# Patient Record
Sex: Female | Born: 2000 | Race: Black or African American | Hispanic: No | Marital: Single | Smoking: Never smoker
Health system: Southern US, Community
[De-identification: ages and names within clinical notes are randomized; demographics above are authoritative.]

---

## 2018-08-05 ENCOUNTER — Encounter (HOSPITAL_COMMUNITY): Payer: Self-pay | Admitting: Emergency Medicine

## 2018-08-05 ENCOUNTER — Emergency Department (HOSPITAL_COMMUNITY)
Admission: EM | Admit: 2018-08-05 | Discharge: 2018-08-06 | Disposition: A | Discharge status: Home / Self Care | Payer: Medicaid Other | Attending: Emergency Medicine | Admitting: Emergency Medicine

## 2018-08-05 ENCOUNTER — Other Ambulatory Visit: Payer: Self-pay

## 2018-08-05 DIAGNOSIS — R1084 Generalized abdominal pain: Secondary | ICD-10-CM

## 2018-08-05 DIAGNOSIS — N72 Inflammatory disease of cervix uteri: Secondary | ICD-10-CM | POA: Insufficient documentation

## 2018-08-05 DIAGNOSIS — N760 Acute vaginitis: Secondary | ICD-10-CM | POA: Insufficient documentation

## 2018-08-05 DIAGNOSIS — Z79899 Other long term (current) drug therapy: Secondary | ICD-10-CM | POA: Insufficient documentation

## 2018-08-05 DIAGNOSIS — B9689 Other specified bacterial agents as the cause of diseases classified elsewhere: Secondary | ICD-10-CM

## 2018-08-05 NOTE — ED Triage Notes (Signed)
C/o generalized abd pain x 3 days with nausea.  States she felt better yesterday and pain worse again today.  Also reports sore throat that started today.

## 2018-08-06 ENCOUNTER — Emergency Department (HOSPITAL_COMMUNITY): Payer: Medicaid Other

## 2018-08-06 LAB — GROUP A STREP BY PCR: GROUP A STREP BY PCR: NOT DETECTED

## 2018-08-06 LAB — WET PREP, GENITAL
Sperm: NONE SEEN
Trich, Wet Prep: NONE SEEN
Yeast Wet Prep HPF POC: NONE SEEN

## 2018-08-06 LAB — CBC
HEMATOCRIT: 43.4 % (ref 36.0–46.0)
HEMOGLOBIN: 14.6 g/dL (ref 12.0–15.0)
MCH: 28.5 pg (ref 26.0–34.0)
MCHC: 33.6 g/dL (ref 30.0–36.0)
MCV: 84.6 fL (ref 78.0–100.0)
Platelets: 293 10*3/uL (ref 150–400)
RBC: 5.13 MIL/uL — AB (ref 3.87–5.11)
RDW: 12.3 % (ref 11.5–15.5)
WBC: 9 10*3/uL (ref 4.0–10.5)

## 2018-08-06 LAB — URINALYSIS, ROUTINE W REFLEX MICROSCOPIC
Bilirubin Urine: NEGATIVE
Glucose, UA: NEGATIVE mg/dL
KETONES UR: 5 mg/dL — AB
Nitrite: NEGATIVE
PH: 8 (ref 5.0–8.0)
PROTEIN: 30 mg/dL — AB
Specific Gravity, Urine: 1.009 (ref 1.005–1.030)

## 2018-08-06 LAB — COMPREHENSIVE METABOLIC PANEL
ALT: 23 U/L (ref 0–44)
AST: 28 U/L (ref 15–41)
Albumin: 3.8 g/dL (ref 3.5–5.0)
Alkaline Phosphatase: 72 U/L (ref 38–126)
Anion gap: 11 (ref 5–15)
BUN: 7 mg/dL (ref 6–20)
CHLORIDE: 101 mmol/L (ref 98–111)
CO2: 22 mmol/L (ref 22–32)
CREATININE: 1.3 mg/dL — AB (ref 0.44–1.00)
Calcium: 9 mg/dL (ref 8.9–10.3)
GFR, EST NON AFRICAN AMERICAN: 59 mL/min — AB (ref >60)
Glucose, Bld: 113 mg/dL — ABNORMAL HIGH (ref 70–99)
POTASSIUM: 3.8 mmol/L (ref 3.5–5.1)
SODIUM: 134 mmol/L — AB (ref 135–145)
Total Bilirubin: 0.7 mg/dL (ref 0.3–1.2)
Total Protein: 7.3 g/dL (ref 6.5–8.1)

## 2018-08-06 LAB — I-STAT BETA HCG BLOOD, ED (MC, WL, AP ONLY)

## 2018-08-06 LAB — LIPASE, BLOOD: LIPASE: 35 U/L (ref 11–51)

## 2018-08-06 MED ORDER — DOXYCYCLINE HYCLATE 100 MG PO CAPS: 100.0000 mg | ORAL_CAPSULE | Freq: Two times a day (BID) | ORAL | 0 refills | Status: DC

## 2018-08-06 MED ORDER — IOPAMIDOL (ISOVUE-300) INJECTION 61%
100.0000 mL | Freq: Once | INTRAVENOUS | Status: AC | PRN
  Administered 2018-08-06: 100 mL via INTRAVENOUS

## 2018-08-06 MED ORDER — METRONIDAZOLE 500 MG PO TABS: 500.0000 mg | ORAL_TABLET | Freq: Two times a day (BID) | ORAL | 0 refills | Status: DC

## 2018-08-06 MED ORDER — SODIUM CHLORIDE 0.9 % IV SOLN
1.0000 g | Freq: Once | INTRAVENOUS | Status: AC
  Administered 2018-08-06: 1 g via INTRAVENOUS
  Filled 2018-08-06: qty 10

## 2018-08-06 MED ORDER — IBUPROFEN 800 MG PO TABS: 800.0000 mg | ORAL_TABLET | Freq: Four times a day (QID) | ORAL | 0 refills | Status: AC | PRN

## 2018-08-06 MED ORDER — SODIUM CHLORIDE 0.9 % IV BOLUS
1000.0000 mL | Freq: Once | INTRAVENOUS | Status: AC
  Administered 2018-08-06: 1000 mL via INTRAVENOUS

## 2018-08-06 MED ORDER — IOPAMIDOL (ISOVUE-300) INJECTION 61%
INTRAVENOUS | Status: AC
  Filled 2018-08-06: qty 100

## 2018-08-06 MED ORDER — ONDANSETRON HCL 4 MG PO TABS: 4.0000 mg | ORAL_TABLET | Freq: Four times a day (QID) | ORAL | 0 refills | Status: AC | PRN

## 2018-08-06 NOTE — ED Provider Notes (Signed)
[facility] EMERGENCY DEPARTMENT Provider Note   CSN: [id number] Arrival date & time: 08/05/18  2344     History   Chief Complaint Chief Complaint  Patient presents with  . Abdominal Pain  . Sore Throat    HPI Christy Adkins is a 17 y.o. female.  Patient presents to the emergency department for evaluation of abdominal pain.  She has been having diffuse abdominal pain with nausea but no vomiting for 3 days.  Patient has not had any diarrhea or constipation.  She denies urinary symptoms.  Pain is mostly in the central upper abdomen, but also in the lower abdomen.  She noticed a sore throat today.  No other cold symptoms.     History reviewed. No pertinent past medical history.  There are no active problems to display for this patient.   History reviewed. No pertinent surgical history.   OB History   None      Home Medications    Prior to Admission medications   Medication Sig Start Date End Date Taking? Authorizing Provider  norelgestromin-ethinyl estradiol (ORTHO EVRA) 150-35 MCG/24HR transdermal patch Place 1 patch onto the skin once a week.   Yes [provider]  doxycycline (VIBRAMYCIN) 100 MG capsule Take 1 capsule (100 mg total) by mouth 2 (two) times daily. 08/06/18   Gilda Crease, MD  ibuprofen (ADVIL,MOTRIN) 800 MG tablet Take 1 tablet (800 mg total) by mouth every 6 (six) hours as needed for moderate pain. 08/06/18   Gilda Crease, MD  metroNIDAZOLE (FLAGYL) 500 MG tablet Take 1 tablet (500 mg total) by mouth 2 (two) times daily. One po bid x 7 days 08/06/18   Gilda Crease, MD  ondansetron (ZOFRAN) 4 MG tablet Take 1 tablet (4 mg total) by mouth every 6 (six) hours as needed for nausea or vomiting. 08/06/18   Pollina, Canary Brim, MD    Family History No family history on file.  Social History Social History   Tobacco Use  . Smoking status: Never Smoker  . Smokeless tobacco: Never Used  Substance  Use Topics  . Alcohol use: Not Currently  . Drug use: Not Currently     Allergies   Patient has no known allergies.   Review of Systems Review of Systems  HENT: Positive for sore throat.   Gastrointestinal: Positive for abdominal pain and nausea.  All other systems reviewed and are negative.    Physical Exam Updated Vital Signs BP 118/74 (BP Location: Left Arm)   Pulse 75   Temp 99.2 F (37.3 C) (Oral)   Resp 17   LMP 07/29/2018   SpO2 100%   Physical Exam  Constitutional: She is oriented to person, place, and time. She appears well-developed and well-nourished. No distress.  HENT:  Head: Normocephalic and atraumatic.  Right Ear: Hearing normal.  Left Ear: Hearing normal.  Nose: Nose normal.  Mouth/Throat: Oropharynx is clear and moist and mucous membranes are normal.  Eyes: Pupils are equal, round, and reactive to light. Conjunctivae and EOM are normal.  Neck: Normal range of motion. Neck supple.  Cardiovascular: Regular rhythm, S1 normal and S2 normal. Exam reveals no gallop and no friction rub.  No murmur heard. Pulmonary/Chest: Effort normal and breath sounds normal. No respiratory distress. She exhibits no tenderness.  Abdominal: Soft. Normal appearance and bowel sounds are normal. There is no hepatosplenomegaly. There is tenderness in the right lower quadrant, epigastric area, suprapubic area and left lower quadrant. There is no rebound,  no guarding, no tenderness at McBurney's point and negative Murphy's sign. No hernia.  Genitourinary: Uterus normal. Cervix exhibits motion tenderness and friability. Right adnexum displays tenderness. Right adnexum displays no mass. Left adnexum displays tenderness. Left adnexum displays no mass. No signs of injury around the vagina. Vaginal discharge found.  Musculoskeletal: Normal range of motion.  Neurological: She is alert and oriented to person, place, and time. She has normal strength. No cranial nerve deficit or sensory  deficit. Coordination normal. GCS eye subscore is 4. GCS verbal subscore is 5. GCS motor subscore is 6.  Skin: Skin is warm, dry and intact. No rash noted. No cyanosis.  Psychiatric: She has a normal mood and affect. Her speech is normal and behavior is normal. Thought content normal.  Nursing note and vitals reviewed.    ED Treatments / Results  Labs (all labs ordered are listed, but only abnormal results are displayed) Labs Reviewed  WET PREP, GENITAL - Abnormal; Notable for the following components:      Result Value   Clue Cells Wet Prep HPF POC PRESENT (*)    WBC, Wet Prep HPF POC MANY (*)    All other components within normal limits  COMPREHENSIVE METABOLIC PANEL - Abnormal; Notable for the following components:   Sodium 134 (*)    Glucose, Bld 113 (*)    Creatinine, Ser 1.30 (*)    GFR calc non Af Amer 59 (*)    All other components within normal limits  CBC - Abnormal; Notable for the following components:   RBC 5.13 (*)    All other components within normal limits  URINALYSIS, ROUTINE W REFLEX MICROSCOPIC - Abnormal; Notable for the following components:   APPearance CLOUDY (*)    Hgb urine dipstick SMALL (*)    Ketones, ur 5 (*)    Protein, ur 30 (*)    Leukocytes, UA LARGE (*)    Bacteria, UA MANY (*)    All other components within normal limits  GROUP A STREP BY PCR  LIPASE, BLOOD  I-STAT BETA HCG BLOOD, ED ([facility], [facility], [facility] ONLY)  GC/CHLAMYDIA PROBE AMP ([place]) NOT AT [facility]    EKG None  Radiology Ct Abdomen Pelvis W Contrast  Result Date: 08/06/2018 CLINICAL DATA:  Waxing and waning abdominal pain with nausea. EXAM: CT ABDOMEN AND PELVIS WITH CONTRAST TECHNIQUE: Multidetector CT imaging of the abdomen and pelvis was performed using the standard protocol following bolus administration of intravenous contrast. CONTRAST:  100 mL ISOVUE-300 IOPAMIDOL (ISOVUE-300) INJECTION 61% COMPARISON:  None. FINDINGS: LOWER CHEST: Lung bases are clear. Included heart size is  normal. No pericardial effusion. HEPATOBILIARY: Liver and gallbladder are normal. PANCREAS: Normal. SPLEEN: Normal. ADRENALS/URINARY TRACT: Kidneys are orthotopic, demonstrating symmetric enhancement. No nephrolithiasis, hydronephrosis or solid renal masses. The unopacified ureters are normal in course and caliber. Urinary bladder is partially distended and unremarkable. Normal adrenal glands. STOMACH/BOWEL: Small volume retained large bowel stool. The stomach, small and large bowel are normal in course and caliber without inflammatory changes. Small amount of small bowel feces compatible with chronic stasis. Normal appendix. VASCULAR/LYMPHATIC: Aortoiliac vessels are normal in course and caliber. No lymphadenopathy by CT size criteria. REPRODUCTIVE: Normal. OTHER: Small amount of free fluid in the pelvis and about the RIGHT ovary. No intraperitoneal free air or focal fluid collections. MUSCULOSKELETAL: Nonacute.  Skeletally immature. IMPRESSION: 1. Small amount of fluid in the pelvis and about the RIGHT ovary, possible ruptured adnexal cyst. 2. Normal appendix. Electronically Signed   By: Pernell Dupre  Bloomer M.D.   On: 08/06/2018 04:27    Procedures Procedures (including critical care time)  Medications Ordered in ED Medications  sodium chloride 0.9 % bolus 1,000 mL (0 mLs Intravenous Stopped 08/06/18 0256)  cefTRIAXone (ROCEPHIN) 1 g in sodium chloride 0.9 % 100 mL IVPB (0 g Intravenous Stopped 08/06/18 0426)  iopamidol (ISOVUE-300) 61 % injection 100 mL (100 mLs Intravenous Contrast Given 08/06/18 0327)     Initial Impression / Assessment and Plan / ED Course  I have reviewed the triage vital signs and the nursing notes.  Pertinent labs & imaging results that were available during my care of the patient were reviewed by me and considered in my medical decision making (see chart for details).     She presents to the emergency department for evaluation of abdominal pain.  Symptoms ongoing for 3  days.  She does have upper abdominal discomfort and mild epigastric tenderness, but had diffuse lower abdominal and pelvic area tenderness as well.  Pelvic exam did reveal cervical motion tenderness, friability.  She does not have tenderness at McBurney's point.  There is no right upper quadrant tenderness or Murphy sign.  She did have a borderline low-grade temp at arrival.  A CT scan was therefore performed.  No acute pathology is noted.  She might have a recently ruptured cyst but no evidence of hemorrhage.  No adnexal abnormalities to suggest abscess.  Based on her pelvic exam, treat for PID, follow-up with OB/GYN.  Final Clinical Impressions(s) / ED Diagnoses   Final diagnoses:  Generalized abdominal pain  Cervicitis  BV (bacterial vaginosis)    ED Discharge Orders         Ordered    doxycycline (VIBRAMYCIN) 100 MG capsule  2 times daily     08/06/18 0435    metroNIDAZOLE (FLAGYL) 500 MG tablet  2 times daily     08/06/18 0435    ibuprofen (ADVIL,MOTRIN) 800 MG tablet  Every 6 hours PRN     08/06/18 0436    ondansetron (ZOFRAN) 4 MG tablet  Every 6 hours PRN     08/06/18 0436           Gilda CreasePollina, Christopher J, MD 08/06/18 [phone]

## 2018-08-06 NOTE — ED Notes (Signed)
Patient transported to CT 

## 2018-08-09 LAB — GC/CHLAMYDIA PROBE AMP (~~LOC~~) NOT AT ARMC
Chlamydia: POSITIVE — AB
Neisseria Gonorrhea: NEGATIVE

## 2018-08-10 ENCOUNTER — Telehealth: Payer: Self-pay | Admitting: Medical

## 2018-08-10 DIAGNOSIS — A749 Chlamydial infection, unspecified: Secondary | ICD-10-CM

## 2018-08-10 MED ORDER — AZITHROMYCIN 250 MG PO TABS: 1000.0000 mg | ORAL_TABLET | Freq: Once | ORAL | 0 refills | Status: AC

## 2018-08-10 NOTE — Telephone Encounter (Addendum)
[place] tested positive for  Chlamydia. Patient was called by RN and allergies and pharmacy confirmed. Rx sent to pharmacy of choice.   Marny Lowenstein, PA-C 08/10/2018 3:42 PM      ----- Message from Kathe Becton, RN sent at 08/10/2018  3:04 PM EDT ----- This patient tested positive for :  Chlamydia  She has  " NKDA", I have informed the patient of her results and confirmed her pharmacy is correct in her chart. Please send Rx.   Thank you,   Kathe Becton, RN   Results faxed to [facility] Department.

## 2019-08-25 IMAGING — CT CT ABD-PELV W/ CM
2 of 4 series · 16 of 46 positions shown, 18 images · IV contrast (Omni 300)
Comparison: None.

CLINICAL DATA: Waxing and waning abdominal pain with nausea.

EXAM:
CT ABDOMEN AND PELVIS WITH CONTRAST
TECHNIQUE: Multidetector CT imaging of the abdomen and pelvis was performed
using the standard protocol following bolus administration of
intravenous contrast.
CONTRAST:  100 mL PI39H9-UDD IOPAMIDOL (PI39H9-UDD) INJECTION 61%

[Series 3: a/p w/ 5mm · axial · 0.84mm/px · z∈[+728,+1143]mm · 13 of 91 slices shown, 15 images]
[im 4/91  soft-tissue]
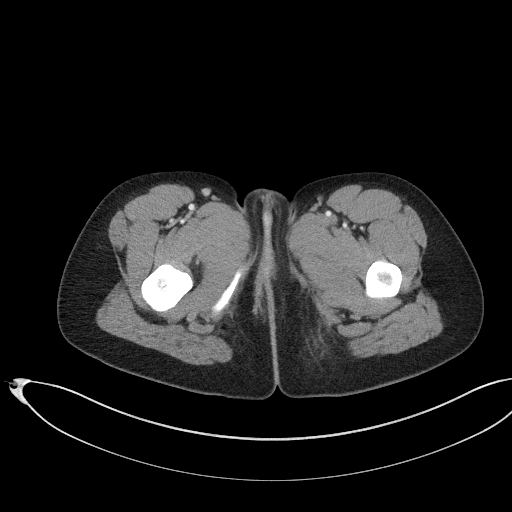
[im 4/91  bone]
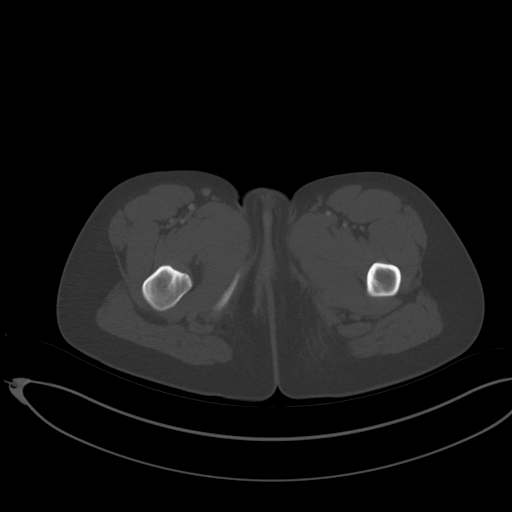
[im 12/91  soft-tissue]
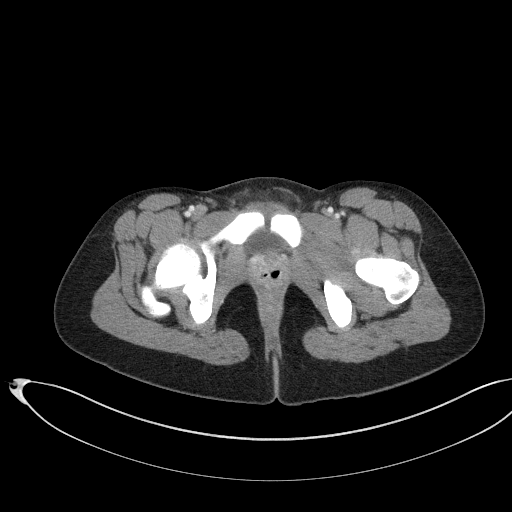
[im 19/91  soft-tissue]
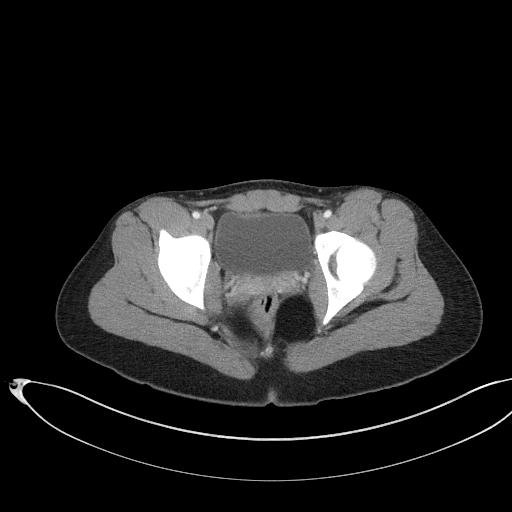
[im 27/91  soft-tissue]
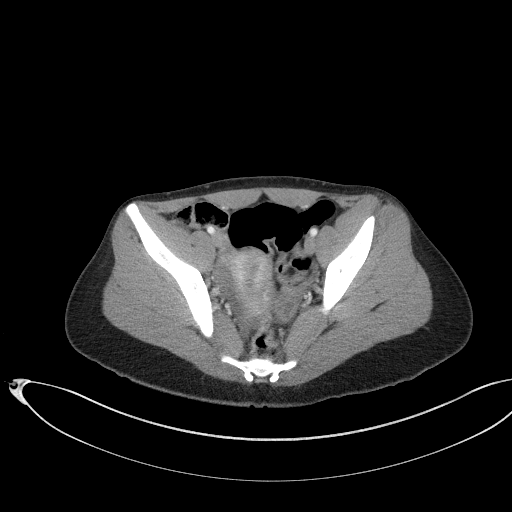
[im 31/91  soft-tissue]
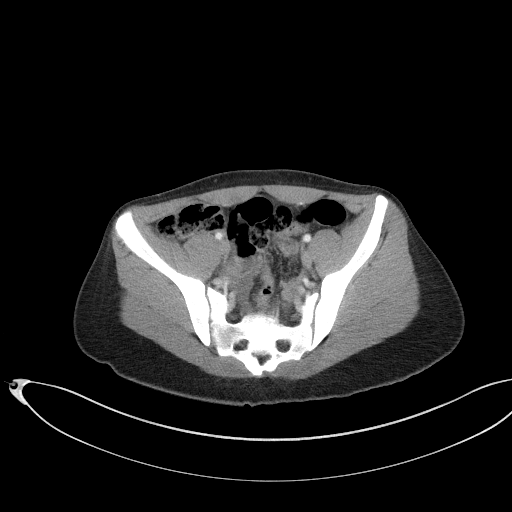
[im 38/91  soft-tissue]
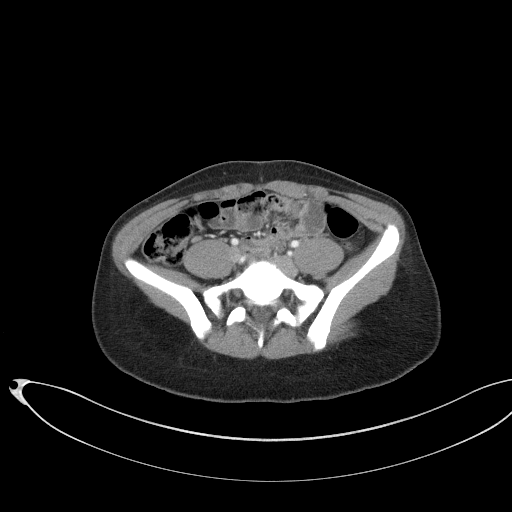
[im 46/91  soft-tissue]
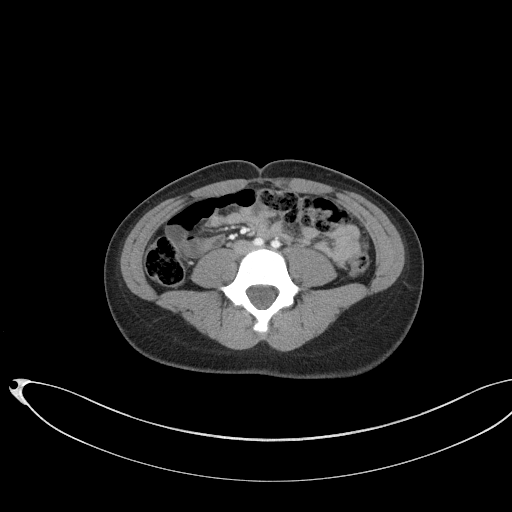
[im 53/91  soft-tissue]
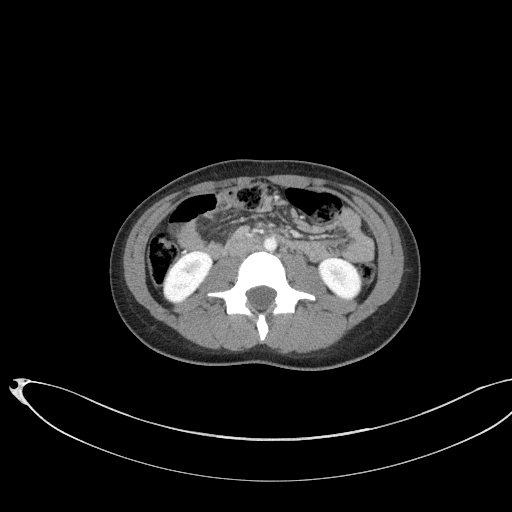
[im 61/91  soft-tissue]
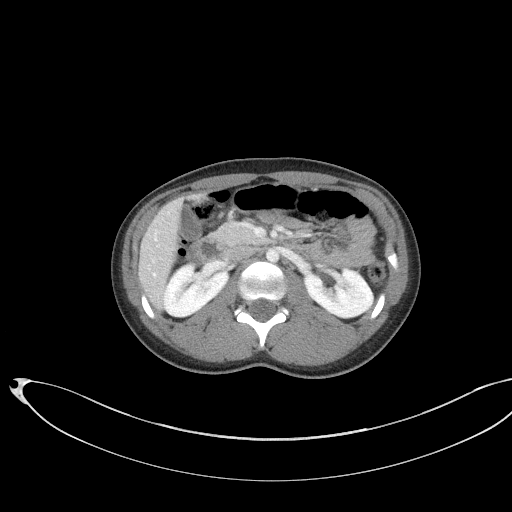
[im 61/91  bone]
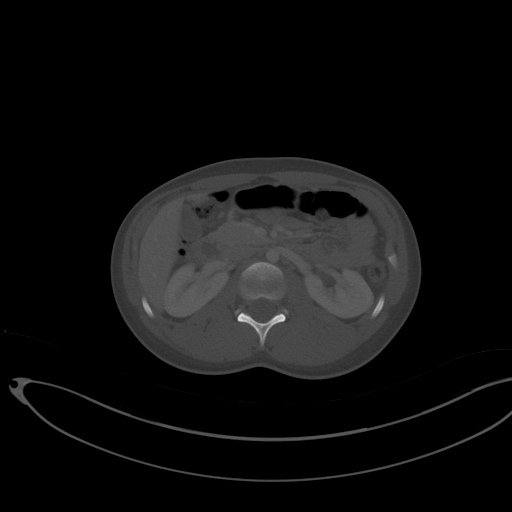
[im 64/91  soft-tissue]
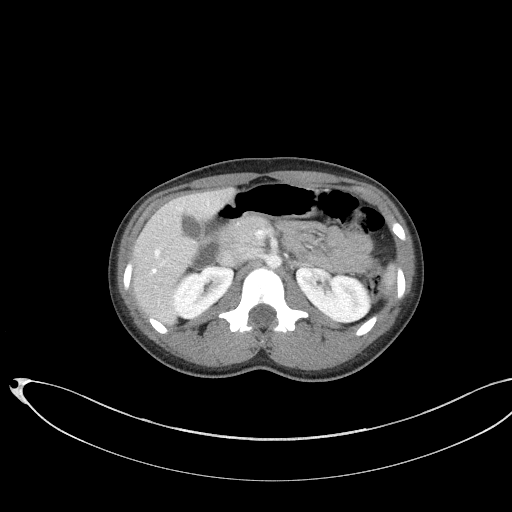
[im 72/91  soft-tissue]
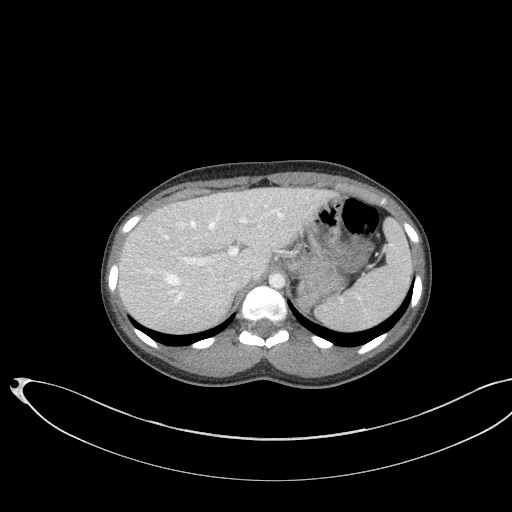
[im 79/91  soft-tissue]
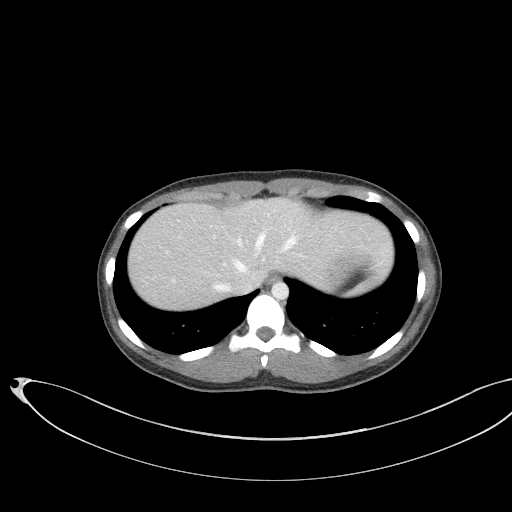
[im 87/91  soft-tissue]
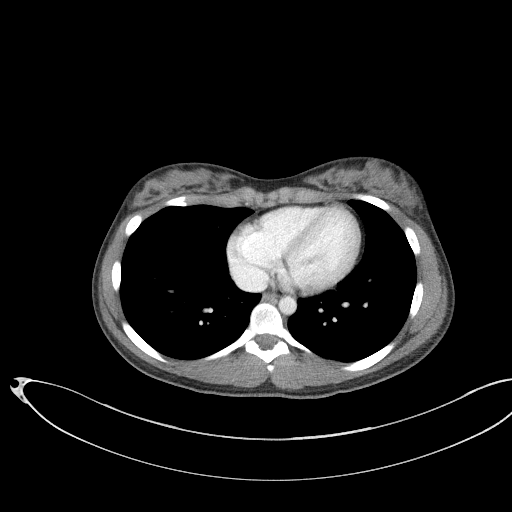

[Series 6: a/p w/ cor · coronal · 0.69mm/px · 3 of 151 slices shown]
[im 51/151  soft-tissue]
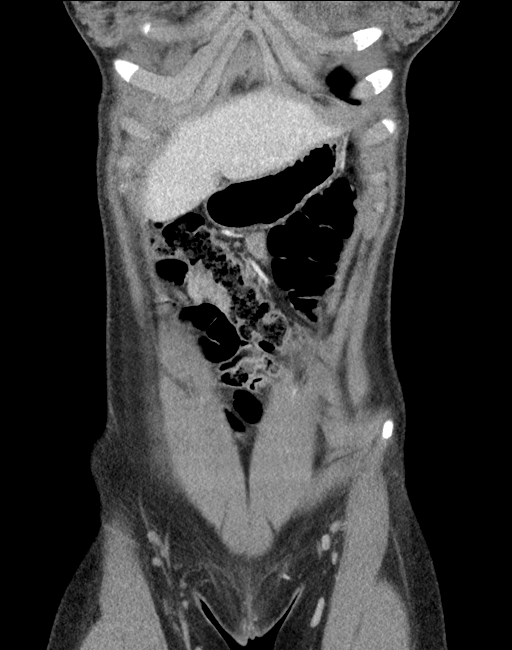
[im 67/151  soft-tissue]
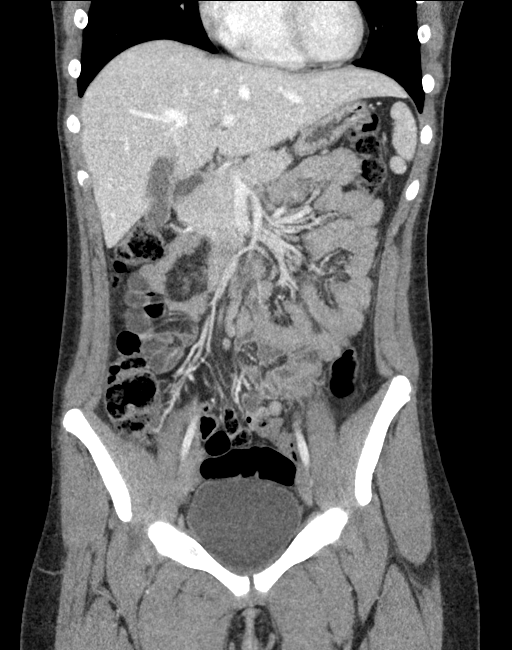
[im 84/151  soft-tissue]
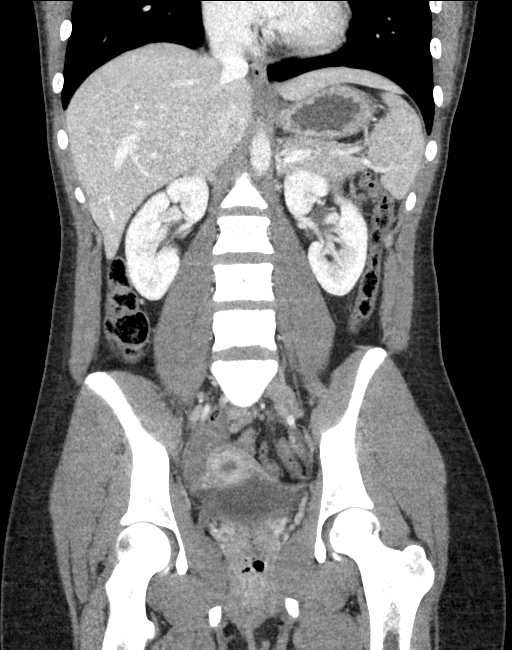

[16 of 46 positions shown; findings below may reference images not displayed]

FINDINGS: LOWER CHEST: Lung bases are clear. Included heart size is normal. No
pericardial effusion.

HEPATOBILIARY: Liver and gallbladder are normal.

PANCREAS: Normal.

SPLEEN: Normal.

ADRENALS/URINARY TRACT: Kidneys are orthotopic, demonstrating
symmetric enhancement. No nephrolithiasis, hydronephrosis or solid
renal masses. The unopacified ureters are normal in course and
caliber. Urinary bladder is partially distended and unremarkable.
Normal adrenal glands.

STOMACH/BOWEL: Small volume retained large bowel stool. The stomach,
small and large bowel are normal in course and caliber without
inflammatory changes. Small amount of small bowel feces compatible
with chronic stasis. Normal appendix.

VASCULAR/LYMPHATIC: Aortoiliac vessels are normal in course and
caliber. No lymphadenopathy by CT size criteria.

REPRODUCTIVE: Normal.

OTHER: Small amount of free fluid in the pelvis and about the RIGHT
ovary. No intraperitoneal free air or focal fluid collections.

MUSCULOSKELETAL: Nonacute.  Skeletally immature.
IMPRESSION: 1. Small amount of fluid in the pelvis and about the RIGHT ovary,
possible ruptured adnexal cyst.
2. Normal appendix.

## 2019-10-29 ENCOUNTER — Encounter (HOSPITAL_COMMUNITY): Payer: Self-pay | Admitting: Emergency Medicine

## 2019-10-29 ENCOUNTER — Emergency Department (HOSPITAL_COMMUNITY): Payer: Medicaid Other

## 2019-10-29 ENCOUNTER — Other Ambulatory Visit: Payer: Self-pay

## 2019-10-29 ENCOUNTER — Emergency Department (HOSPITAL_COMMUNITY)
Admission: EM | Admit: 2019-10-29 | Discharge: 2019-10-29 | Disposition: A | Discharge status: Home / Self Care | Payer: Medicaid Other | Attending: Emergency Medicine | Admitting: Emergency Medicine

## 2019-10-29 DIAGNOSIS — Y9389 Activity, other specified: Secondary | ICD-10-CM | POA: Insufficient documentation

## 2019-10-29 DIAGNOSIS — S63696A Other sprain of right little finger, initial encounter: Secondary | ICD-10-CM | POA: Insufficient documentation

## 2019-10-29 DIAGNOSIS — S63616A Unspecified sprain of right little finger, initial encounter: Secondary | ICD-10-CM

## 2019-10-29 DIAGNOSIS — Z79899 Other long term (current) drug therapy: Secondary | ICD-10-CM | POA: Insufficient documentation

## 2019-10-29 DIAGNOSIS — Y999 Unspecified external cause status: Secondary | ICD-10-CM | POA: Insufficient documentation

## 2019-10-29 DIAGNOSIS — X58XXXA Exposure to other specified factors, initial encounter: Secondary | ICD-10-CM | POA: Insufficient documentation

## 2019-10-29 DIAGNOSIS — Y9289 Other specified places as the place of occurrence of the external cause: Secondary | ICD-10-CM | POA: Insufficient documentation

## 2019-10-29 NOTE — Discharge Instructions (Signed)
You can take Tylenol or Ibuprofen as directed for pain. You can alternate Tylenol and Ibuprofen every 4 hours. If you take Tylenol at 1pm, then you can take Ibuprofen at 5pm. Then you can take Tylenol again at 9pm.   Use splint for support and protection.  You can apply ice to help with pain and swelling.  Follow-up with referred hand doctor as active.  Return the emergency department for any worsening pain, swelling, fevers or any other worsening concerning symptoms.

## 2019-10-29 NOTE — ED Triage Notes (Signed)
Pt reports that yesterday she and her brother were arguing. She hurt her right 5th finger in process of grabbing on to him. Unable to bend. Having pains and swelling.

## 2019-10-29 NOTE — ED Notes (Signed)
Ortho tech at bedside 

## 2019-10-29 NOTE — ED Provider Notes (Signed)
[facility] DEPT Provider Note   CSN: [id number] Arrival date & time: 10/29/19  1204     History   Chief Complaint Chief Complaint  Patient presents with  . Finger Injury    HPI Christy Adkins is a 18 y.o. female who presents for evaluation of right fifth digit pain, swelling that began yesterday.  She reports that she was arguing with her brother and went to grab him.  She states that at some point, she hurt the finger but is unclear exactly the mechanism.  She reports that today, she had worsening pain and swelling noted to her right fifth digit.  She reports that it hurts to bend.  She has some tingling sensation noted to the distal aspect of the finger.     The history is provided by the patient.    History reviewed. No pertinent past medical history.  There are no active problems to display for this patient.   History reviewed. No pertinent surgical history.   OB History   No obstetric history on file.      Home Medications    Prior to Admission medications   Medication Sig Start Date End Date Taking? Authorizing Provider  doxycycline (VIBRAMYCIN) 100 MG capsule Take 1 capsule (100 mg total) by mouth 2 (two) times daily. 08/06/18   Orpah Greek, MD  ibuprofen (ADVIL,MOTRIN) 800 MG tablet Take 1 tablet (800 mg total) by mouth every 6 (six) hours as needed for moderate pain. 08/06/18   Orpah Greek, MD  metroNIDAZOLE (FLAGYL) 500 MG tablet Take 1 tablet (500 mg total) by mouth 2 (two) times daily. One po bid x 7 days 08/06/18   Orpah Greek, MD  norelgestromin-ethinyl estradiol (ORTHO EVRA) 150-35 MCG/24HR transdermal patch Place 1 patch onto the skin once a week.    [provider]  ondansetron (ZOFRAN) 4 MG tablet Take 1 tablet (4 mg total) by mouth every 6 (six) hours as needed for nausea or vomiting. 08/06/18   Pollina, Gwenyth Allegra, MD    Family History No family history on file.  Social History  Social History   Tobacco Use  . Smoking status: Never Smoker  . Smokeless tobacco: Never Used  Substance Use Topics  . Alcohol use: Not Currently  . Drug use: Not Currently     Allergies   Patient has no known allergies.   Review of Systems Review of Systems  Musculoskeletal:       Right fifth digit pain  Neurological: Positive for numbness.  All other systems reviewed and are negative.    Physical Exam Updated Vital Signs BP 119/75   Pulse 89   Temp 98.5 F (36.9 C) (Oral)   Resp 15   LMP 09/17/2019   SpO2 100%   Physical Exam Vitals signs and nursing note reviewed.  Constitutional:      Appearance: She is well-developed.  HENT:     Head: Normocephalic and atraumatic.  Eyes:     General: No scleral icterus.       Right eye: No discharge.        Left eye: No discharge.     Conjunctiva/sclera: Conjunctivae normal.  Cardiovascular:     Pulses:          Radial pulses are 2+ on the right side and 2+ on the left side.  Pulmonary:     Effort: Pulmonary effort is normal.  Musculoskeletal:     Comments: Tenderness palpation the distal aspect of right  fifth digit with overlying ecchymosis, soft tissue swelling.  No deformity or crepitus noted.  Full flexion/tension of the PIP intact any difficulty.  When DIP is held in isolation, she can very minimally flex.  DIP extension appears to be intact.  No other tenderness palpation noted to other digits, metacarpals.  No snuffbox tenderness.  Skin:    General: Skin is warm and dry.     Comments: Good distal cap refill.  RUE is not dusky in appearance or cool to touch.  Neurological:     Mental Status: She is alert.     Comments: Mild subjective decrease sensation in distal aspect of right fifth digit.  Sensation otherwise intact.  Psychiatric:        Speech: Speech normal.        Behavior: Behavior normal.      ED Treatments / Results  Labs (all labs ordered are listed, but only abnormal results are displayed)  Labs Reviewed - No data to display  EKG None  Radiology Dg Finger Little Right  Result Date: 10/29/2019 CLINICAL DATA:  Pain and swelling mid fifth finger. EXAM: RIGHT LITTLE FINGER 2+V COMPARISON:  None. FINDINGS: There is no evidence of fracture or dislocation. There is no evidence of arthropathy or other focal bone abnormality. Soft tissues are unremarkable. IMPRESSION: Negative. Electronically Signed   By: Kennith Center M.D.   On: 10/29/2019 12:58    Procedures Procedures (including critical care time)  Medications Ordered in ED Medications - No data to display   Initial Impression / Assessment and Plan / ED Course  I have reviewed the triage vital signs and the nursing notes.  Pertinent labs & imaging results that were available during my care of the patient were reviewed by me and considered in my medical decision making (see chart for details).        18 year old female who presents for evaluation of right fifth digit pain that began yesterday.  Unsure of exactly what happened.  Today reports pain with movement, ecchymosis. Patient is afebrile, non-toxic appearing, sitting comfortably on examination table. Vital signs reviewed and stable.  Patient with good radial pulses, cap refill.  Reports some mild decrease sensation on distal aspect.  Consider fracture versus location versus sprain versus tendon injury.  X-ray reviewed.  No evidence of acute fracture or dislocation.  Discussed results with patient.  I discussed with her that there still could be a ligament or tendon injury.  At this time, favor ligament injury.  She is able to minimally flex at the DIP but has significant pain with doing so so question if this is true tendon injury versus difficulty secondary to pain.  We will plan to splint and have her follow-up with outpatient hand.  At this time, patient exhibits no emergent life-threatening condition that require further evaluation in ED or admission. Patient had  ample opportunity for questions and discussion. All patient's questions were answered with full understanding. Strict return precautions discussed. Patient expresses understanding and agreement to plan.   Portions of this note were generated with Scientist, clinical (histocompatibility and immunogenetics). Dictation errors may occur despite best attempts at proofreading.    Final Clinical Impressions(s) / ED Diagnoses   Final diagnoses:  Sprain of right little finger, unspecified site of digit, initial encounter    ED Discharge Orders    None       Rosana Hoes 10/29/19 1415    Pricilla Loveless, MD 10/30/19 1021

## 2019-12-31 ENCOUNTER — Other Ambulatory Visit: Payer: Self-pay

## 2019-12-31 ENCOUNTER — Encounter (HOSPITAL_COMMUNITY): Payer: Self-pay

## 2019-12-31 ENCOUNTER — Emergency Department (HOSPITAL_COMMUNITY): Payer: Self-pay

## 2019-12-31 ENCOUNTER — Emergency Department (HOSPITAL_COMMUNITY)
Admission: EM | Admit: 2019-12-31 | Discharge: 2019-12-31 | Disposition: A | Discharge status: Home / Self Care | Payer: Self-pay | Attending: Emergency Medicine | Admitting: Emergency Medicine

## 2019-12-31 DIAGNOSIS — K6289 Other specified diseases of anus and rectum: Secondary | ICD-10-CM | POA: Insufficient documentation

## 2019-12-31 DIAGNOSIS — K5900 Constipation, unspecified: Secondary | ICD-10-CM | POA: Insufficient documentation

## 2019-12-31 LAB — POC URINE PREG, ED: Preg Test, Ur: NEGATIVE

## 2019-12-31 MED ORDER — POLYETHYLENE GLYCOL 3350 17 G PO PACK: 17.0000 g | PACK | Freq: Every day | ORAL | 0 refills | Status: AC

## 2019-12-31 MED ORDER — HYDROCORTISONE ACETATE 25 MG RE SUPP: 25.0000 mg | Freq: Two times a day (BID) | RECTAL | 0 refills | Status: AC

## 2019-12-31 NOTE — ED Triage Notes (Addendum)
Patient c/o constipation X1 week.  Patient reports last BM 2-3 days ago after using a laxative.   Patient reports she still feels constipated and has noticed a small amount of bright red blood and dark brown blood in stool intermittent X2 weeks. Patient states she is worried due to having a family history of colon cancer.   Denies hemorrhoids but reports using hemorrhoid cream with no help.  A/ox4 Ambulatory in triage.

## 2019-12-31 NOTE — Discharge Instructions (Addendum)
It is recommended that you drink plenty of water daily to help you bowels move. Increase your fiber intake.  Use the miralax until you have a good bowel movement. Use the suppositories twice daily to help with rectal pain. You can also use a stool softener such as colace (docusate sodium) to help keep your bowel movements regular and soft.  Follow up with your primary care if symptoms persist.  Return to the ER if you have severely worsening abdominal pain, fever, or new or concerning symptoms.

## 2019-12-31 NOTE — ED Provider Notes (Signed)
[facility] DEPT Provider Note   CSN: [id number] Arrival date & time: 12/31/19  4098     History Chief Complaint  Patient presents with  . Constipation    Christy Adkins is a 19 y.o. female without significant past medical history, presenting to the emergency department with complaint of constipation x1 to 2 weeks.  She states she has had difficulty having a bowel movement with straining.  She treated with a laxative that helped her have a bowel movement, however she is still having difficulty.  She reports pain in her rectum with straining and bowel movements.  She endorses intermittent lower abdominal pain only when she is attempting to have a bowel movement.  No nausea, vomiting, fevers.  She noticed a small amount of bright red blood with her bowel movement and has been treating with hemorrhoid cream without much relief.  She admits to not drinking much water throughout the day. No history of abdominal surgeries or other medical problems.  The history is provided by the patient.       History reviewed. No pertinent past medical history.  There are no problems to display for this patient.   History reviewed. No pertinent surgical history.   OB History   No obstetric history on file.     History reviewed. No pertinent family history.  Social History   Tobacco Use  . Smoking status: Never Smoker  . Smokeless tobacco: Never Used  Substance Use Topics  . Alcohol use: Not Currently  . Drug use: Not Currently    Home Medications Prior to Admission medications   Medication Sig Start Date End Date Taking? Authorizing Provider  OVER THE COUNTER MEDICATION Take 1 tablet by mouth as needed (constipation). *laxative*   Yes [provider]  hydrocortisone (ANUSOL-HC) 25 MG suppository Place 1 suppository (25 mg total) rectally 2 (two) times daily. 12/31/19   Galilee Pierron, [place] N, PA-C  ibuprofen (ADVIL,MOTRIN) 800 MG tablet Take 1 tablet (800  mg total) by mouth every 6 (six) hours as needed for moderate pain. Patient not taking: Reported on 12/31/2019 08/06/18   Orpah Greek, MD  ondansetron (ZOFRAN) 4 MG tablet Take 1 tablet (4 mg total) by mouth every 6 (six) hours as needed for nausea or vomiting. Patient not taking: Reported on 12/31/2019 08/06/18   Orpah Greek, MD  polyethylene glycol (MIRALAX / GLYCOLAX) 17 g packet Take 17 g by mouth daily. 12/31/19   Dempsey Ahonen, [place] N, PA-C    Allergies    Patient has no known allergies.  Review of Systems   Review of Systems  All other systems reviewed and are negative.   Physical Exam Updated Vital Signs BP (!) 136/112 (BP Location: Left Arm)   Pulse (!) 101   Temp 98.1 F (36.7 C) (Oral)   Resp 15   LMP 12/17/2019   SpO2 100%   Physical Exam Vitals and nursing note reviewed.  Constitutional:      General: She is not in acute distress.    Appearance: She is well-developed.  HENT:     Head: Normocephalic and atraumatic.  Eyes:     Conjunctiva/sclera: Conjunctivae normal.  Cardiovascular:     Rate and Rhythm: Normal rate and regular rhythm.  Pulmonary:     Effort: Pulmonary effort is normal. No respiratory distress.     Breath sounds: Normal breath sounds.  Abdominal:     General: Bowel sounds are normal.     Palpations: Abdomen is soft.  Tenderness: There is no abdominal tenderness. There is no guarding or rebound.  Genitourinary:    Comments: Rectal exam performed with female RN chaperone present. No visualized external hemorrhoids, however pain with DRE just inside the anus. No palpable thrombosed hemorrhoids. Possible small internal hemorrhoid palpated. No gross blood. No obvious stool impaction however pt did not tolerate much of the exam. Skin:    General: Skin is warm.  Neurological:     Mental Status: She is alert.  Psychiatric:        Behavior: Behavior normal.     ED Results / Procedures / Treatments   Labs (all labs ordered are  listed, but only abnormal results are displayed) Labs Reviewed  POC URINE PREG, ED    EKG None  Radiology DG Abdomen 1 View  Result Date: 12/31/2019 CLINICAL DATA:  Lower abdominal pain for 2 weeks. Evaluate for air-fluid levels. EXAM: ABDOMEN - 1 VIEW COMPARISON:  No comparison studies available. FINDINGS: Insert no free air bowel gas pattern is normal without appreciable air-fluid levels. No substantial stool burden. 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 IMPRESSION: Negative. Electronically Signed   By: Kennith Center M.D.   On: 12/31/2019 11:00    Procedures Procedures (including critical care time)  Medications Ordered in ED Medications - No data to display  ED Course  I have reviewed the triage vital signs and the nursing notes.  Pertinent labs & imaging results that were available during my care of the patient were reviewed by me and considered in my medical decision making (see chart for details).    MDM Rules/Calculators/A&P                      Patient presenting with constipation x1 to 2 weeks as well as rectal pain with bowel movements and straining.  Abdominal exam is benign.  Rectal exam with tenderness on palpation, no obvious thrombosed hemorrhoids.  No gross blood.  No obvious fecal impaction.  KUB without significant stool burden or air-fluid level to suggest SBO.  Do not believe further work-up is indicated at this time.  Recommend symptomatic management.  Patient admits to not drinking much water daily.  Recommend she increase her water intake as well as treat with MiraLAX and suppositories for symptoms.  Encourage she follow-up with her PCP if symptoms persist.  Strict return precautions discussed.  Patient is agreeable to plan and safe for discharge.  Discussed results, findings, treatment and follow up. Patient advised of return precautions. Patient verbalized understanding and agreed with plan.  Final Clinical  Impression(s) / ED Diagnoses Final diagnoses:  Constipation, unspecified constipation type  Rectal pain    Rx / DC Orders ED Discharge Orders         Ordered    polyethylene glycol (MIRALAX / GLYCOLAX) 17 g packet  Daily     12/31/19 1127    hydrocortisone (ANUSOL-HC) 25 MG suppository  2 times daily     12/31/19 1127           Riaz Onorato, [place] N, [place] 12/31/19 1130    Terrilee Files, MD 12/31/19 1718

## 2020-11-19 ENCOUNTER — Emergency Department (HOSPITAL_COMMUNITY)
Admission: EM | Admit: 2020-11-19 | Discharge: 2020-11-19 | Disposition: A | Discharge status: Home / Self Care | Payer: Medicaid Other | Attending: Emergency Medicine | Admitting: Emergency Medicine

## 2020-11-19 DIAGNOSIS — Z3201 Encounter for pregnancy test, result positive: Secondary | ICD-10-CM | POA: Insufficient documentation

## 2020-11-19 LAB — POC URINE PREG, ED: Preg Test, Ur: POSITIVE — AB

## 2020-11-19 NOTE — ED Triage Notes (Signed)
Emergency Medicine Provider OB Triage Evaluation Note  Christy Adkins is a 19 y.o. female, No obstetric history on file., at approximately 6-[redacted] weeks gestation who presents to the emergency department for "prenatal visit." Pt LNMP 10/16. She states that she took a couple of home pregnancy tests and they were positive. She is currently here visiting family and wanted to have her baby checked out and have an ultrasound done. This is her first pregnancy. She denies any symptoms including fevers, chills, abdominal pain, nausea, vomiting, vaginal bleeding, urinary symptoms, or any other associated symptoms.   Review of  Systems  Positive: + pregnancy Negative: - fevers, chills, abdominal pain, nausea, vomiting, vaginal bleeding  Physical Exam  BP 118/75   Pulse 86   Temp 98.3 F (36.8 C) (Oral)   Resp 16   Ht 5' 4.5" (1.638 m)   Wt 56.7 kg   SpO2 98%   BMI 21.12 kg/m  General: Awake, no distress  HEENT: Atraumatic  Resp: Normal effort  Cardiac: Normal rate Abd: Nondistended, nontender  MSK: Moves all extremities without difficulty Neuro: Speech clear  Medical Decision Making  Pt evaluated for pregnancy. She is here for a routine prenatal check up without any symptoms. On arrival to the ED VSS. Pt is in no acute distress. She has no complaints whatsoever at this time. I do not feel she needs emergent work up at this time and feel she can be followed in the outpatient setting for prenatal visit.   3:14 PM Discussed with [facility] APP, Sam, who agrees with outpatient follow up. Pt medically screened and does not need further work up in the ED today.   Clinical Impression  No diagnosis found.     Tanda Rockers, PA-C 11/19/20 [phone]

## 2020-11-19 NOTE — ED Notes (Signed)
PA Hyman Hopes made aware of Pos preg test, will eval patient in triage for transport to [facility]

## 2020-11-19 NOTE — Discharge Instructions (Addendum)
Please schedule an appointment with [facility] for follow up purposes given your positive pregnancy test  Return to the ED for any symptoms

## 2024-03-05 ENCOUNTER — Emergency Department (HOSPITAL_COMMUNITY)
Admission: EM | Admit: 2024-03-05 | Discharge: 2024-03-05 | Disposition: A | Discharge status: Home / Self Care | Payer: Self-pay | Attending: Emergency Medicine | Admitting: Emergency Medicine

## 2024-03-05 DIAGNOSIS — R739 Hyperglycemia, unspecified: Secondary | ICD-10-CM | POA: Insufficient documentation

## 2024-03-05 DIAGNOSIS — R112 Nausea with vomiting, unspecified: Secondary | ICD-10-CM | POA: Insufficient documentation

## 2024-03-05 DIAGNOSIS — Z3202 Encounter for pregnancy test, result negative: Secondary | ICD-10-CM | POA: Insufficient documentation

## 2024-03-05 LAB — COMPREHENSIVE METABOLIC PANEL WITH GFR
ALT: 16 U/L (ref 0–44)
AST: 22 U/L (ref 15–41)
Albumin: 4 g/dL (ref 3.5–5.0)
Alkaline Phosphatase: 42 U/L (ref 38–126)
Anion gap: 10 (ref 5–15)
BUN: 14 mg/dL (ref 6–20)
CO2: 21 mmol/L — ABNORMAL LOW (ref 22–32)
Calcium: 9 mg/dL (ref 8.9–10.3)
Chloride: 104 mmol/L (ref 98–111)
Creatinine, Ser: 1.02 mg/dL — ABNORMAL HIGH (ref 0.44–1.00)
GFR, Estimated: >60 mL/min (ref >60)
Glucose, Bld: 113 mg/dL — ABNORMAL HIGH (ref 70–99)
Potassium: 3.5 mmol/L (ref 3.5–5.1)
Sodium: 135 mmol/L (ref 135–145)
Total Bilirubin: 1 mg/dL (ref 0.0–1.2)
Total Protein: 7.6 g/dL (ref 6.5–8.1)

## 2024-03-05 LAB — CBC
HCT: 45.6 % (ref 36.0–46.0)
Hemoglobin: 15.8 g/dL — ABNORMAL HIGH (ref 12.0–15.0)
MCH: 29 pg (ref 26.0–34.0)
MCHC: 34.6 g/dL (ref 30.0–36.0)
MCV: 83.7 fL (ref 80.0–100.0)
Platelets: 276 10*3/uL (ref 150–400)
RBC: 5.45 MIL/uL — ABNORMAL HIGH (ref 3.87–5.11)
RDW: 12.8 % (ref 11.5–15.5)
WBC: 5.8 10*3/uL (ref 4.0–10.5)
nRBC: 0 % (ref 0.0–0.2)

## 2024-03-05 LAB — URINALYSIS, ROUTINE W REFLEX MICROSCOPIC
Bilirubin Urine: NEGATIVE
Glucose, UA: NEGATIVE mg/dL
Hgb urine dipstick: NEGATIVE
Ketones, ur: 20 mg/dL — AB
Leukocytes,Ua: NEGATIVE
Nitrite: NEGATIVE
Protein, ur: 30 mg/dL — AB
Specific Gravity, Urine: 1.032 — ABNORMAL HIGH (ref 1.005–1.030)
pH: 5 (ref 5.0–8.0)

## 2024-03-05 LAB — POC URINE PREG, ED: Preg Test, Ur: NEGATIVE

## 2024-03-05 MED ORDER — ONDANSETRON 8 MG PO TBDP: 8.0000 mg | ORAL_TABLET | Freq: Three times a day (TID) | ORAL | 0 refills | Status: AC | PRN

## 2024-03-05 MED ORDER — ONDANSETRON HCL 4 MG/2ML IJ SOLN
4.0000 mg | Freq: Once | INTRAMUSCULAR | Status: AC
  Administered 2024-03-05: 4 mg via INTRAVENOUS
  Filled 2024-03-05: qty 2

## 2024-03-05 MED ORDER — LACTATED RINGERS IV BOLUS
1000.0000 mL | Freq: Once | INTRAVENOUS | Status: AC
  Administered 2024-03-05: 1000 mL via INTRAVENOUS

## 2024-03-05 NOTE — ED Triage Notes (Signed)
 Patient with multiple episodes of emesis yesterday with generalized abdominal pain. 1 episode of diarrhea. No emesis or diarrhea today, though does still have abdominal pain. States the last time she felt like this she was pregnant. LMP 2/28.

## 2024-03-05 NOTE — ED Provider Notes (Signed)
 [facility] EMERGENCY DEPARTMENT AT [facility] Provider Note   CSN: [id number] Arrival date & time: 03/05/24  7829     History  Chief Complaint  Patient presents with   Emesis    Christy Adkins is a 23 y.o. female.  HPI 23 yo female presents complaining of nausea and vomiting x 24 hours. Emesis x12-nbnb.  Vomited after any po.  Last symptoms like this were with pregnancy.  LMP 2/28 normal except 2 days late.  No pregnancy test.  No fever, or diarrhea.  Maybe some chills.  No uti symptoms. No meds NKDA     Home Medications Prior to Admission medications   Medication Sig Start Date End Date Taking? Authorizing Provider  ondansetron (ZOFRAN-ODT) 8 MG disintegrating tablet Take 1 tablet (8 mg total) by mouth every 8 (eight) hours as needed for nausea or vomiting. 03/05/24  Yes Margarita Grizzle, MD  hydrocortisone (ANUSOL-HC) 25 MG suppository Place 1 suppository (25 mg total) rectally 2 (two) times daily. 12/31/19   Robinson, [place] N, PA-C  ibuprofen (ADVIL,MOTRIN) 800 MG tablet Take 1 tablet (800 mg total) by mouth every 6 (six) hours as needed for moderate pain. Patient not taking: Reported on 12/31/2019 08/06/18   Gilda Crease, MD  ondansetron (ZOFRAN) 4 MG tablet Take 1 tablet (4 mg total) by mouth every 6 (six) hours as needed for nausea or vomiting. Patient not taking: Reported on 12/31/2019 08/06/18   Gilda Crease, MD  OVER THE COUNTER MEDICATION Take 1 tablet by mouth as needed (constipation). *laxative*    [provider]  polyethylene glycol (MIRALAX / GLYCOLAX) 17 g packet Take 17 g by mouth daily. 12/31/19   Robinson, [place] N, PA-C      Allergies    Patient has no known allergies.    Review of Systems   Review of Systems  Physical Exam Updated Vital Signs BP 111/77   Pulse 74   Temp 98.6 F (37 C) (Oral)   Resp 16   Ht 1.626 m (5\' 4" )   Wt 63.5 kg   LMP 02/04/2024 (Exact Date)   SpO2 98%   BMI 24.03 kg/m  Physical  Exam Vitals reviewed.  HENT:     Head: Normocephalic.     Right Ear: External ear normal.     Left Ear: External ear normal.     Nose: Nose normal.  Cardiovascular:     Rate and Rhythm: Normal rate and regular rhythm.     Pulses: Normal pulses.  Pulmonary:     Effort: Pulmonary effort is normal.     Breath sounds: Normal breath sounds.  Abdominal:     General: Abdomen is flat. Bowel sounds are normal.     Palpations: Abdomen is soft.     Comments: Mild diffuse ttp  Musculoskeletal:        General: Normal range of motion.     Cervical back: Normal range of motion.  Skin:    General: Skin is warm.     Capillary Refill: Capillary refill takes less than 2 seconds.  Neurological:     General: No focal deficit present.     Mental Status: She is alert.  Psychiatric:        Mood and Affect: Mood normal.     ED Results / Procedures / Treatments   Labs (all labs ordered are listed, but only abnormal results are displayed) Labs Reviewed  URINALYSIS, ROUTINE W REFLEX MICROSCOPIC - Abnormal; Notable for the following components:  Result Value   Color, Urine AMBER (*)    APPearance HAZY (*)    Specific Gravity, Urine 1.032 (*)    Ketones, ur 20 (*)    Protein, ur 30 (*)    Bacteria, UA RARE (*)    All other components within normal limits  CBC - Abnormal; Notable for the following components:   RBC 5.45 (*)    Hemoglobin 15.8 (*)    All other components within normal limits  COMPREHENSIVE METABOLIC PANEL WITH GFR - Abnormal; Notable for the following components:   CO2 21 (*)    Glucose, Bld 113 (*)    Creatinine, Ser 1.02 (*)    All other components within normal limits  POC URINE PREG, ED    EKG None  Radiology No results found.  Procedures Procedures    Medications Ordered in ED Medications  ondansetron (ZOFRAN) injection 4 mg (4 mg Intravenous Given 03/05/24 0952)  lactated ringers bolus 1,000 mL (0 mLs Intravenous Stopped 03/05/24 1054)    ED Course/  Medical Decision Making/ A&P Clinical Course as of 03/05/24 1059  Sun Mar 05, 2024  1029 CBC reviewed interpreted and mildly elevated hemoglobin otherwise within normal limits [DR]  1029 Pregnancy test is negative [DR]  1029 Urinalysis shows 20 ketones 30 protein 0-5 red blood cells 0-5 white blood cells and rare bacteria with squamous cells present [DR]    Clinical Course User Index [DR] Margarita Grizzle, MD                                 Medical Decision Making Amount and/or Complexity of Data Reviewed Labs: ordered.  Risk Prescription drug management.  23 year old female presents today with nausea and vomiting.  She has mild diffuse tenderness on exam Differential diagnosis includes but is not limited to gastroenteritis, pregnancy, other intra-abdominal etiologies including cholecystitis, small bowel obstruction, gastroparesis, appendicitis.  Based on physical exam doubt acute appendicitis, cholecystitis, or small bowel obstruction.  Patient's pregnancy test is negative. Evidence of UTI on urinalysis Labs essentially normal although mild decrease CO2, hyperglycemia, creatinine 1.02 and hemoglobin 15.8. Patient improved here with IV fluids and antiemetics. Oral fluid being given now Discussed return precautions and need for follow-up and patient voices understanding Work note given to return on Tuesday        Final Clinical Impression(s) / ED Diagnoses Final diagnoses:  Nausea and vomiting, unspecified vomiting type    Rx / DC Orders ED Discharge Orders          Ordered    ondansetron (ZOFRAN-ODT) 8 MG disintegrating tablet  Every 8 hours PRN        03/05/24 1059              Margarita Grizzle, MD 03/05/24 1059
